# Patient Record
Sex: Female | Born: 1995
Health system: Southern US, Community
[De-identification: ages and names within clinical notes are randomized; demographics above are authoritative.]

## PROBLEM LIST (undated history)

## (undated) DIAGNOSIS — N200 Calculus of kidney: Secondary | ICD-10-CM

## (undated) HISTORY — PX: TONSILLECTOMY: SUR1361

---
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
## 2017-01-04 DIAGNOSIS — Z01419 Encounter for gynecological examination (general) (routine) without abnormal findings: Secondary | ICD-10-CM | POA: Diagnosis not present

## 2017-02-15 DIAGNOSIS — N200 Calculus of kidney: Secondary | ICD-10-CM | POA: Diagnosis not present

## 2017-04-26 ENCOUNTER — Emergency Department (HOSPITAL_COMMUNITY): Payer: Commercial Managed Care - PPO

## 2017-04-26 ENCOUNTER — Other Ambulatory Visit: Payer: Self-pay

## 2017-04-26 ENCOUNTER — Encounter (HOSPITAL_COMMUNITY): Payer: Self-pay | Admitting: *Deleted

## 2017-04-26 ENCOUNTER — Emergency Department (HOSPITAL_COMMUNITY)
Admission: EM | Admit: 2017-04-26 | Discharge: 2017-04-26 | Disposition: A | Discharge status: Home / Self Care | Payer: Commercial Managed Care - PPO | Attending: Emergency Medicine | Admitting: Emergency Medicine

## 2017-04-26 DIAGNOSIS — R1011 Right upper quadrant pain: Secondary | ICD-10-CM | POA: Diagnosis not present

## 2017-04-26 DIAGNOSIS — R109 Unspecified abdominal pain: Secondary | ICD-10-CM | POA: Diagnosis not present

## 2017-04-26 DIAGNOSIS — R1031 Right lower quadrant pain: Secondary | ICD-10-CM | POA: Insufficient documentation

## 2017-04-26 DIAGNOSIS — R112 Nausea with vomiting, unspecified: Secondary | ICD-10-CM | POA: Diagnosis not present

## 2017-04-26 DIAGNOSIS — R197 Diarrhea, unspecified: Secondary | ICD-10-CM | POA: Insufficient documentation

## 2017-04-26 DIAGNOSIS — Z79899 Other long term (current) drug therapy: Secondary | ICD-10-CM | POA: Diagnosis not present

## 2017-04-26 DIAGNOSIS — R1110 Vomiting, unspecified: Secondary | ICD-10-CM | POA: Diagnosis not present

## 2017-04-26 LAB — URINALYSIS, ROUTINE W REFLEX MICROSCOPIC
BILIRUBIN URINE: NEGATIVE
Bacteria, UA: NONE SEEN
Glucose, UA: NEGATIVE mg/dL
Ketones, ur: NEGATIVE mg/dL
LEUKOCYTES UA: NEGATIVE
NITRITE: NEGATIVE
PH: 5 (ref 5.0–8.0)
Protein, ur: 30 mg/dL — AB
SPECIFIC GRAVITY, URINE: 1.024 (ref 1.005–1.030)

## 2017-04-26 LAB — LIPASE, BLOOD: Lipase: 20 U/L (ref 11–51)

## 2017-04-26 LAB — CBC
HCT: 43.5 % (ref 36.0–46.0)
Hemoglobin: 14.6 g/dL (ref 12.0–15.0)
MCH: 30.5 pg (ref 26.0–34.0)
MCHC: 33.6 g/dL (ref 30.0–36.0)
MCV: 91 fL (ref 78.0–100.0)
PLATELETS: 265 10*3/uL (ref 150–400)
RBC: 4.78 MIL/uL (ref 3.87–5.11)
RDW: 13.2 % (ref 11.5–15.5)
WBC: 15.4 10*3/uL — AB (ref 4.0–10.5)

## 2017-04-26 LAB — COMPREHENSIVE METABOLIC PANEL
ALK PHOS: 75 U/L (ref 38–126)
ALT: 18 U/L (ref 14–54)
AST: 23 U/L (ref 15–41)
Albumin: 4.3 g/dL (ref 3.5–5.0)
Anion gap: 14 (ref 5–15)
BILIRUBIN TOTAL: 1 mg/dL (ref 0.3–1.2)
BUN: 13 mg/dL (ref 6–20)
CALCIUM: 9.2 mg/dL (ref 8.9–10.3)
CO2: 21 mmol/L — ABNORMAL LOW (ref 22–32)
CREATININE: 0.85 mg/dL (ref 0.44–1.00)
Chloride: 101 mmol/L (ref 101–111)
GFR calc Af Amer: >60 mL/min (ref >60)
GLUCOSE: 124 mg/dL — AB (ref 65–99)
Potassium: 4.3 mmol/L (ref 3.5–5.1)
Sodium: 136 mmol/L (ref 135–145)
TOTAL PROTEIN: 7.5 g/dL (ref 6.5–8.1)

## 2017-04-26 LAB — I-STAT BETA HCG BLOOD, ED (MC, WL, AP ONLY)

## 2017-04-26 MED ORDER — ONDANSETRON 8 MG PO TBDP: 8.0000 mg | ORAL_TABLET | Freq: Three times a day (TID) | ORAL | 0 refills | Status: AC | PRN

## 2017-04-26 MED ORDER — MORPHINE SULFATE (PF) 4 MG/ML IV SOLN
4.0000 mg | Freq: Once | INTRAVENOUS | Status: AC
  Administered 2017-04-26: 4 mg via INTRAVENOUS
  Filled 2017-04-26: qty 1

## 2017-04-26 MED ORDER — IOPAMIDOL (ISOVUE-300) INJECTION 61%
INTRAVENOUS | Status: AC
  Administered 2017-04-26: 100 mL
  Filled 2017-04-26: qty 100

## 2017-04-26 MED ORDER — ONDANSETRON HCL 4 MG/2ML IJ SOLN
4.0000 mg | Freq: Once | INTRAMUSCULAR | Status: AC
  Administered 2017-04-26: 4 mg via INTRAVENOUS
  Filled 2017-04-26: qty 2

## 2017-04-26 MED ORDER — ACETAMINOPHEN 325 MG PO TABS
650.0000 mg | ORAL_TABLET | Freq: Once | ORAL | Status: AC
Start: 2017-04-26 — End: 2017-04-26
  Administered 2017-04-26: 650 mg via ORAL
  Filled 2017-04-26: qty 2

## 2017-04-26 MED ORDER — ONDANSETRON 4 MG PO TBDP
4.0000 mg | ORAL_TABLET | Freq: Once | ORAL | Status: AC
  Administered 2017-04-26: 4 mg via ORAL
  Filled 2017-04-26: qty 1

## 2017-04-26 MED ORDER — SODIUM CHLORIDE 0.9 % IV BOLUS (SEPSIS)
1000.0000 mL | Freq: Once | INTRAVENOUS | Status: AC
  Administered 2017-04-26: 1000 mL via INTRAVENOUS

## 2017-04-26 MED ORDER — SODIUM CHLORIDE 0.9 % IV SOLN
Freq: Once | INTRAVENOUS | Status: AC
  Administered 2017-04-26: 15:00:00 via INTRAVENOUS

## 2017-04-26 NOTE — ED Triage Notes (Signed)
To ED for eval of RLQ pain, n/v/d that started this am. [facility] Labs drawn with an elevated wbc. States she felt fine yesterday. Actively vomiting in triage

## 2017-04-26 NOTE — Discharge Instructions (Signed)
Take tylenol or motrin for pain and fever. Take zofran for nausea and vomiting as prescribed as needed. Drink plenty of fluids. Rest. Follow up with family doctor or school doctor if not improving in 2-3 days. Return if worsening.

## 2017-04-26 NOTE — ED Provider Notes (Signed)
[facility] EM[facility]ENC[facility] Provider Note   CSN: [id number] Arrival date & time: 04/26/17  1200     History   Chief Complaint Chief Complaint  Patient presents with  . Abdominal Pain  . Emesis  . Nausea    HPI Noralee CharsSarah E Peeks is a 22 y.o. female.  HPI Noralee CharsSarah E Kearl is a 22 y.o. female with hx of kidney stones and traumatic kidney lac, presents to ED with complaint of nausea, vomiting, abdominal pain. Pt states symptoms started this morning. Reports associated diarrhea. [facility], had negative influenza panel, had CBC done which showed elevated white blood cell count, patient was sent here for further evaluation.  Patient states that she is having some abdominal pain that is worse on the right side.  She was given Zofran at triage and feels slightly better.  She reports associated dizziness and weakness, states that "feel dehydrated."  Patient does have polycystic ovarian syndrome, and has irregular menstrual cycles, last menstrual cycle was in November.  Denies being pregnant.  No other treatment prior to coming in.  Nothing making her symptoms better or worse.  Past Medical History:  Diagnosis Date  . Kidney stone     Patient Active Problem List   Diagnosis Date Noted  . Renal vein laceration, right, initial encounter 07/28/2016  . Abnormality of gait 11/09/2013    Past Surgical History:  Procedure Laterality Date  . TONSILLECTOMY      OB History    Gravida Para Term Preterm AB Living   1             SAB TAB Ectopic Multiple Live Births                   Home Medications    Prior to Admission medications   Medication Sig Start Date End Date Taking? Authorizing Provider  oxyCODONE (OXY IR/ROXICODONE) 5 MG immediate release tablet Take 1 tablet (5 mg total) by mouth every 4 (four) hours as needed for moderate pain. 07/30/16   Focht, Joyce CopaJessica L, PA  progesterone (PROMETRIUM) 200 MG capsule Take 200 mg by mouth every 3  (three) months. Every 3 months for 10 days or until periods starts.    [provider]    Family History No family history on file.  Social History Social History   Tobacco Use  . Smoking status: Never Smoker  . Smokeless tobacco: Never Used  Substance Use Topics  . Alcohol use: No  . Drug use: No     Allergies   Patient has no known allergies.   Review of Systems Review of Systems  Constitutional: Positive for chills and fever.  Respiratory: Negative for cough, chest tightness and shortness of breath.   Cardiovascular: Negative for chest pain, palpitations and leg swelling.  Gastrointestinal: Positive for abdominal pain, diarrhea, nausea and vomiting. Negative for blood in stool.  Genitourinary: Negative for difficulty urinating, dysuria, flank pain, frequency, hematuria, pelvic pain, vaginal bleeding, vaginal discharge and vaginal pain.  Musculoskeletal: Negative for arthralgias, myalgias, neck pain and neck stiffness.  Skin: Negative for rash.  Neurological: Negative for dizziness, weakness and headaches.  All other systems reviewed and are negative.    Physical Exam Updated Vital Signs BP 115/78 (BP Location: Left Arm)   Pulse 99   Temp 99.5 F (37.5 C) (Oral)   Resp 16   LMP 02/13/2017 (Approximate)   SpO2 98%   Physical Exam  Constitutional: She appears  well-developed and well-nourished. No distress.  HENT:  Head: Normocephalic.  Eyes: Conjunctivae are normal.  Neck: Neck supple.  Cardiovascular: Normal rate, regular rhythm and normal heart sounds.  Pulmonary/Chest: Effort normal and breath sounds normal. No respiratory distress. She has no wheezes. She has no rales.  Abdominal: Soft. Bowel sounds are normal. She exhibits no distension. There is tenderness in the right upper quadrant and right lower quadrant. There is no rebound.  Musculoskeletal: She exhibits no edema.  Neurological: She is alert.  Skin: Skin is warm and dry.  Psychiatric: She  has a normal mood and affect. Her behavior is normal.  Nursing note and vitals reviewed.    ED Treatments / Results  Labs (all labs ordered are listed, but only abnormal results are displayed) Labs Reviewed  CBC - Abnormal; Notable for the following components:      Result Value   WBC 15.4 (*)    All other components within normal limits  URINALYSIS, ROUTINE W REFLEX MICROSCOPIC - Abnormal; Notable for the following components:   Hgb urine dipstick MODERATE (*)    Protein, ur 30 (*)    Squamous Epithelial / LPF 0-5 (*)    All other components within normal limits  LIPASE, BLOOD  COMPREHENSIVE METABOLIC PANEL  I-STAT BETA HCG BLOOD, ED ([facility], [facility], [facility] ONLY)    EKG  EKG Interpretation None       Radiology No results found.  Procedures Procedures (including critical care time)  Medications Ordered in ED Medications  sodium chloride 0.9 % bolus 1,000 mL (not administered)  ondansetron (ZOFRAN) injection 4 mg (not administered)  0.9 %  sodium chloride infusion (not administered)  ondansetron (ZOFRAN-ODT) disintegrating tablet 4 mg (4 mg Oral Given 04/26/17 1302)     Initial Impression / Assessment and Plan / ED Course  I have reviewed the triage vital signs and the nursing notes.  Pertinent labs & imaging results that were available during my care of the patient were reviewed by me and considered in my medical decision making (see chart for details).     Patient in the emergency department with nausea, vomiting, diarrhea, right-sided abdominal pain.  Fever of 100.5. Differential includes gastroenteritis versus cholecystitis, versus appendicitis.  White blood cell count is 15.4.  She is not pregnant.  CMP and lipase pending.  Will get CT abdomen pelvis for further evaluation.   5:24 PM CT scan negative for appendicitis.  No other acute findings. Pt feels much better. She received IV fluids, zofran, morphine. She has drank two cups of sprite, holding it down, no reccurent  nausea. Most likely viral gastroenteritis. Plan to dc home with anti emetics and otherwise symptomatic treatment.  Return precautions discussed.   Vitals:   04/26/17 1500 04/26/17 1515 04/26/17 1545 04/26/17 1630  BP: 107/66 110/72 103/69   Pulse: 97 98 99 (!) 104  Resp:      Temp:      TempSrc:      SpO2: 99% 100% 100% 99%    Final Clinical Impressions(s) / ED Diagnoses   Final diagnoses:  Nausea vomiting and diarrhea    ED Discharge Orders    None       Iona Coach 04/26/17 1729    Tilden Fossa, MD 04/26/17 [phone]

## 2017-04-26 NOTE — ED Notes (Signed)
Patient got a drink per PA request

## 2018-02-12 IMAGING — CT CT ABD-PELV W/ CM
2 of 5 series · 14 of 47 positions shown, 17 images · IV contrast (APPLIED)
Comparison: None.

CLINICAL DATA: Right-sided abdominal pain after being kicked while
playing soccer

EXAM:
CT ABDOMEN AND PELVIS WITH CONTRAST
TECHNIQUE: Multidetector CT imaging of the abdomen and pelvis was performed
using the standard protocol following bolus administration of
intravenous contrast.
CONTRAST:  100mL [id number] IOPAMIDOL ([id number]) INJECTION 61%

[Series 3: abd/ pelvis 5.0 i30f 2 · axial · 0.72mm/px · z∈[-639,-164]mm · 11 of 107 slices shown, 14 images]
[im 6/107  brain]
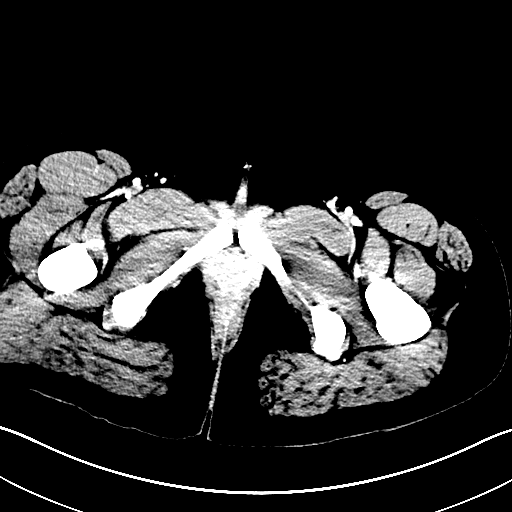
[im 6/107  bone]
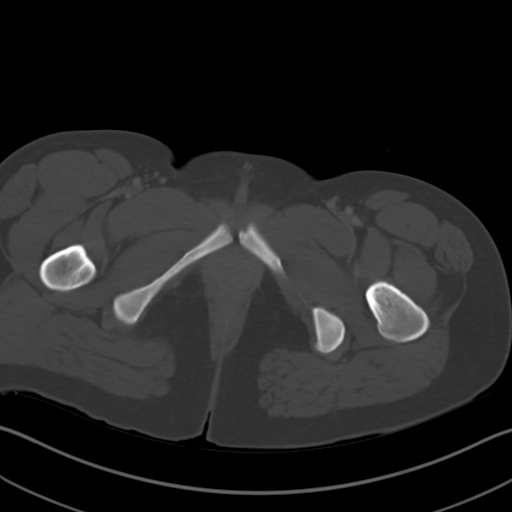
[im 18/107  brain]
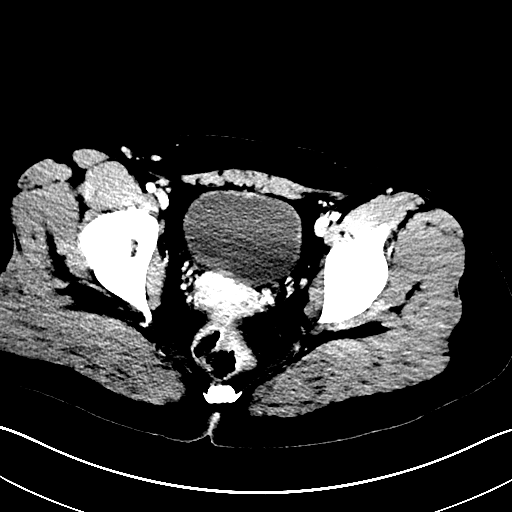
[im 24/107  brain]
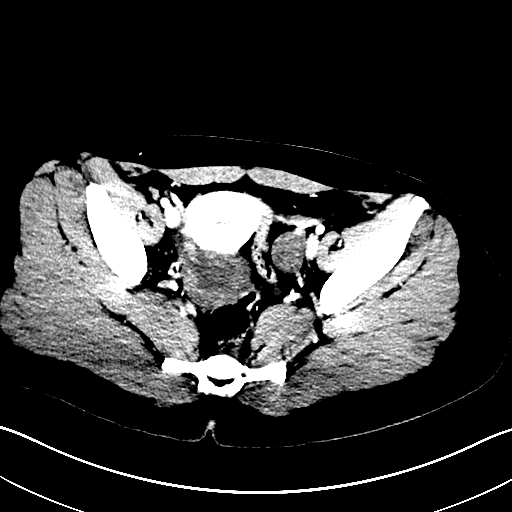
[im 36/107  brain]
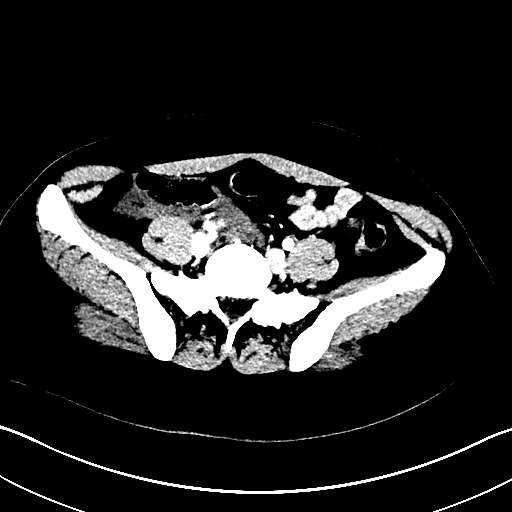
[im 42/107  brain]
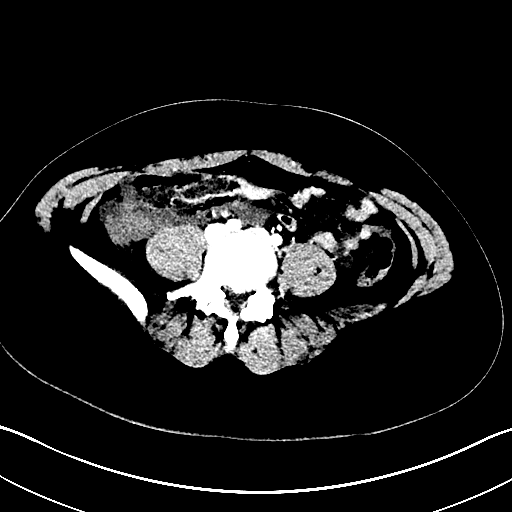
[im 42/107  bone]
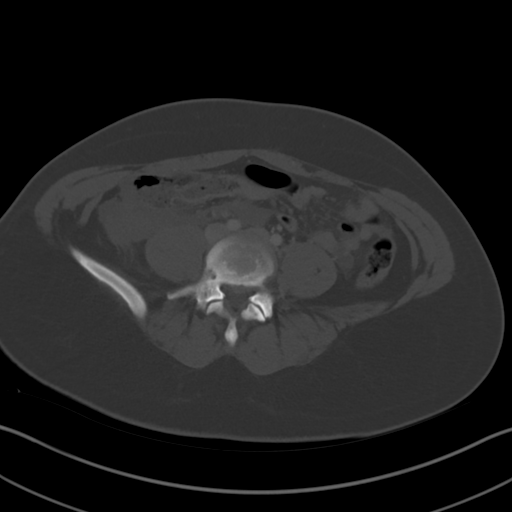
[im 54/107  brain]
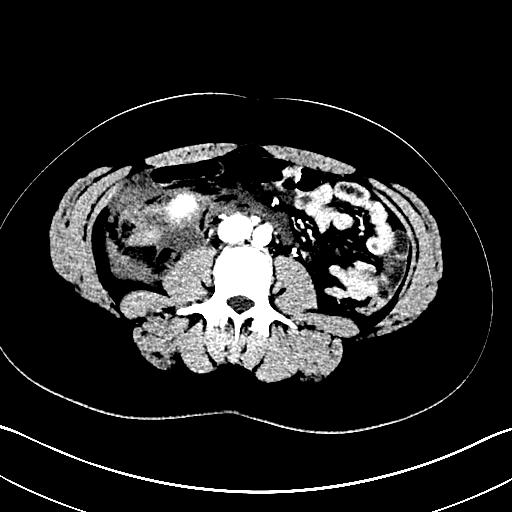
[im 65/107  brain]
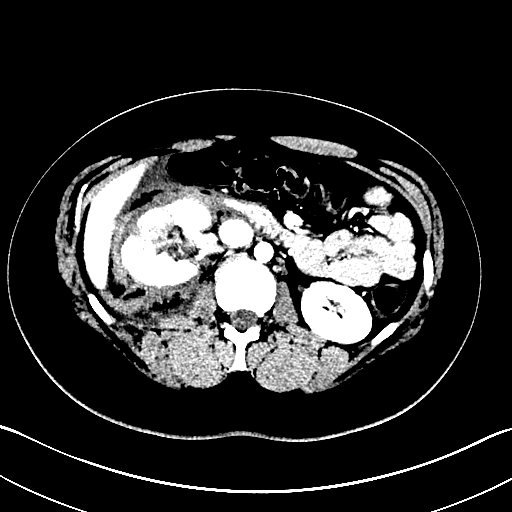
[im 71/107  brain]
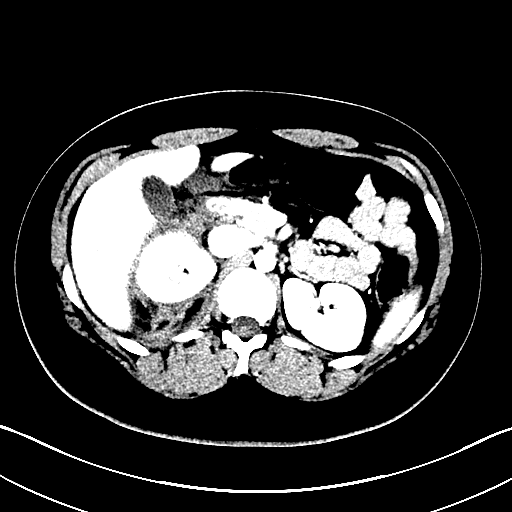
[im 83/107  brain]
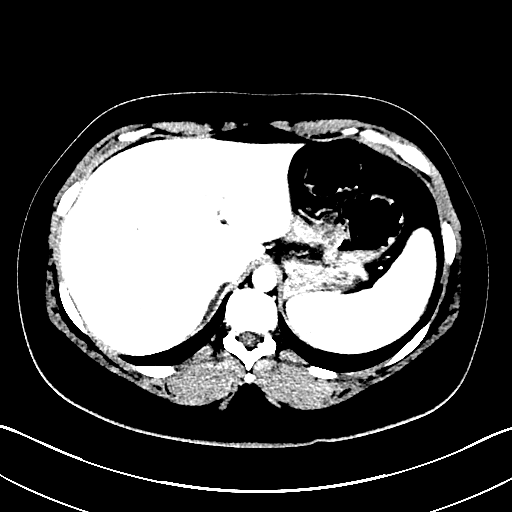
[im 83/107  bone]
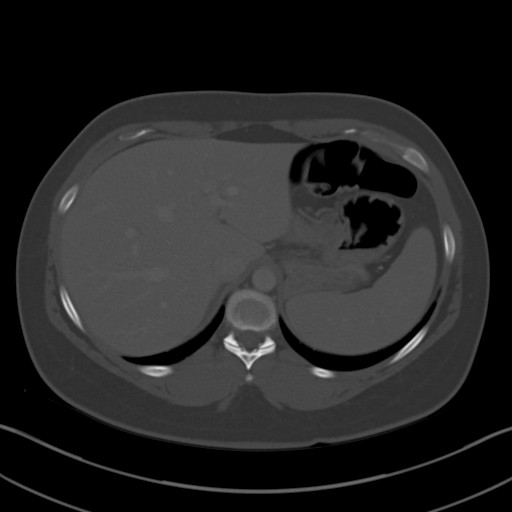
[im 89/107  brain]
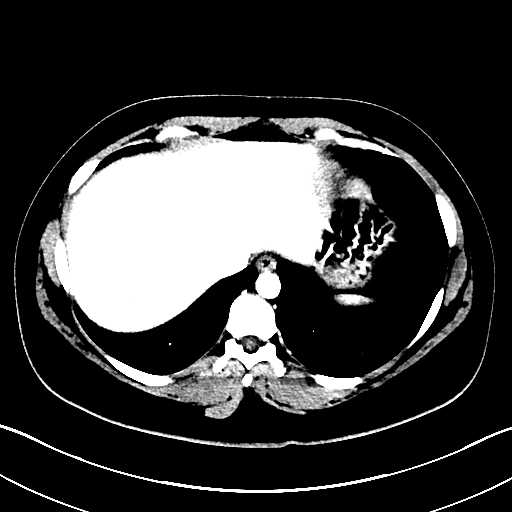
[im 101/107  brain]
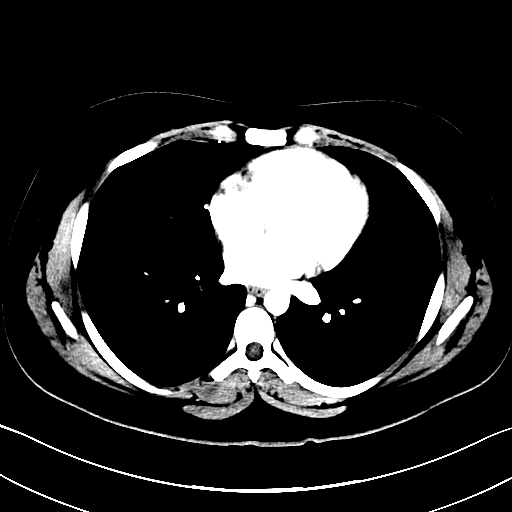

[Series 6: coronal soft tissue · coronal · 0.71mm/px · 3 of 101 slices shown]
[im 34/101  brain]
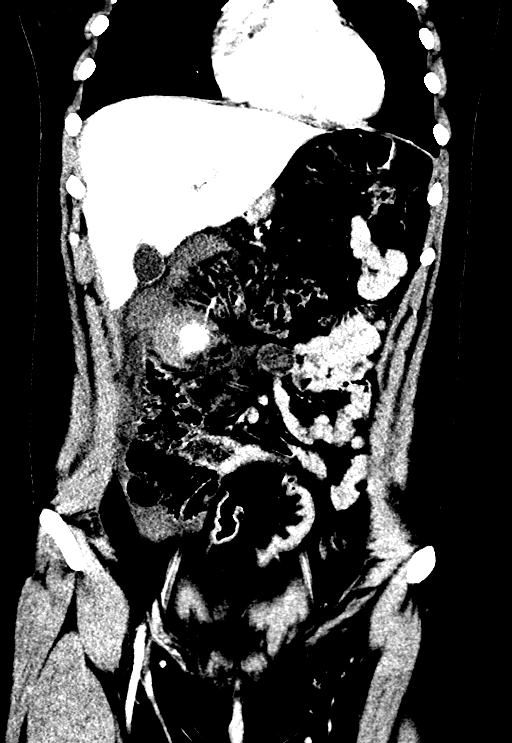
[im 45/101  brain]
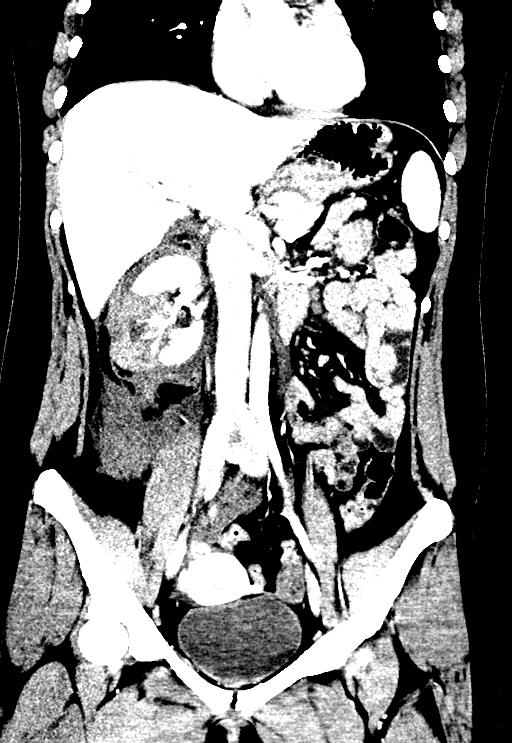
[im 56/101  brain]
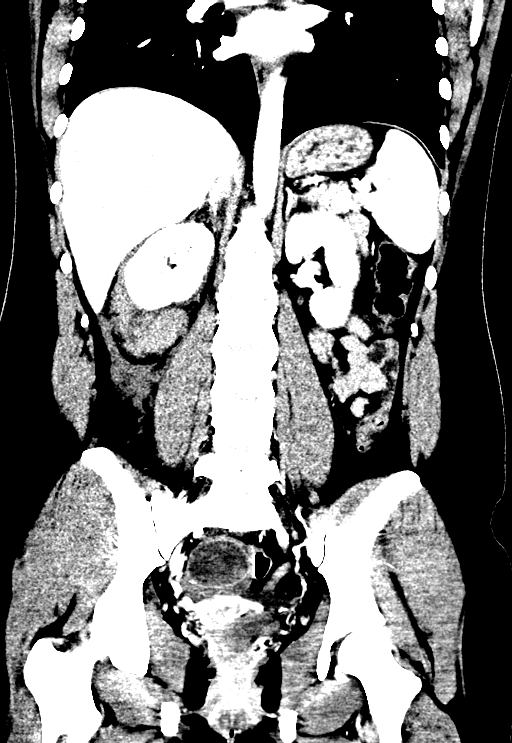

[14 of 47 positions shown; findings below may reference images not displayed]

FINDINGS: Lower chest: No acute abnormality.

Hepatobiliary: No focal liver abnormality is seen. No gallstones,
gallbladder wall thickening, or biliary dilatation.

Pancreas: Unremarkable. No pancreatic ductal dilatation or
surrounding inflammatory changes.

Spleen: Normal in size without focal abnormality.

Adrenals/Urinary Tract: Both adrenals are normal. There is a
laceration of the right kidney, involving the hilum. Moderately
large right retroperitoneal hemorrhage. No evidence of active
extravasation at the time of this scan. No evidence of urine leak on
delayed imaging. Renal artery and vein appear intact. Left kidney is
normal. Urinary bladder is unremarkable.

Stomach/Bowel: Stomach is within normal limits. Colon appears
normal. No evidence of bowel wall thickening, distention, or
inflammatory changes.

Vascular/Lymphatic: No significant vascular findings are present. No
enlarged abdominal or pelvic lymph nodes.

Reproductive: Uterus and left ovary unremarkable. Right ovarian cyst
measuring a little over 4 cm.

Other: No extraluminal gas.

Musculoskeletal: No evidence of fracture.
IMPRESSION: Right renal laceration with moderately large right retroperitoneal
hemorrhage. No evidence of active extravasation of the time of this
scan. No evidence of urine leak on delayed imaging. These results
were called by telephone at the time of interpretation on 07/28/2016
at [DATE] to Dr. RANTONA BHEBHE , who verbally acknowledged these
results.

## 2018-11-11 IMAGING — CT CT ABD-PELV W/ CM
2 of 4 series · 16 of 46 positions shown, 18 images · IV contrast (APPLIED)
Comparison: None.

CLINICAL DATA: Right lower quadrant pain, nausea/vomiting

EXAM:
CT ABDOMEN AND PELVIS WITH CONTRAST
TECHNIQUE: Multidetector CT imaging of the abdomen and pelvis was performed
using the standard protocol following bolus administration of
intravenous contrast.
CONTRAST:  100mL [id number] IOPAMIDOL ([id number]) INJECTION 61%

[Series 3: abd/ pelvis 5.0 i30f 2 · axial · 0.90mm/px · z∈[+732,+1228]mm · 13 of 109 slices shown, 15 images]
[im 5/109  soft-tissue]
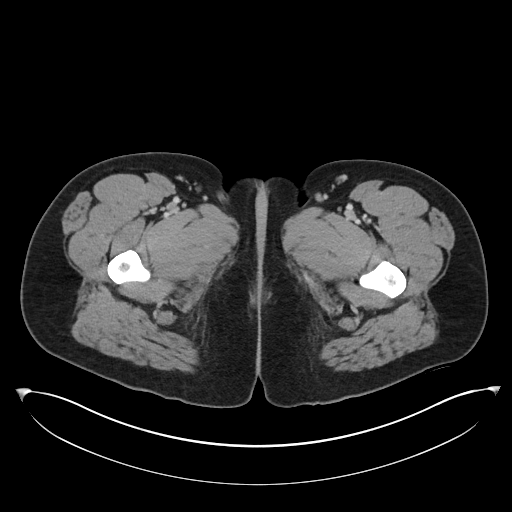
[im 5/109  bone]
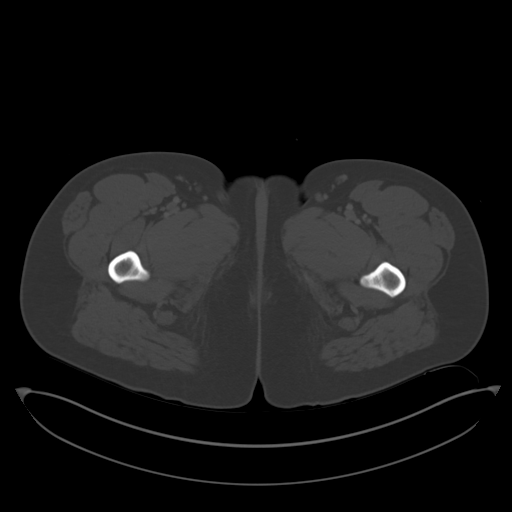
[im 13/109  soft-tissue]
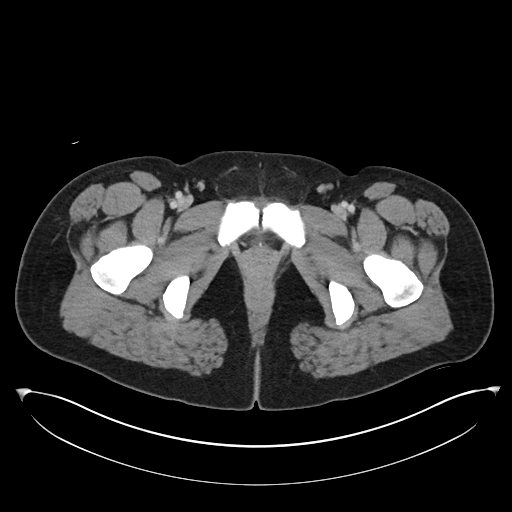
[im 22/109  soft-tissue]
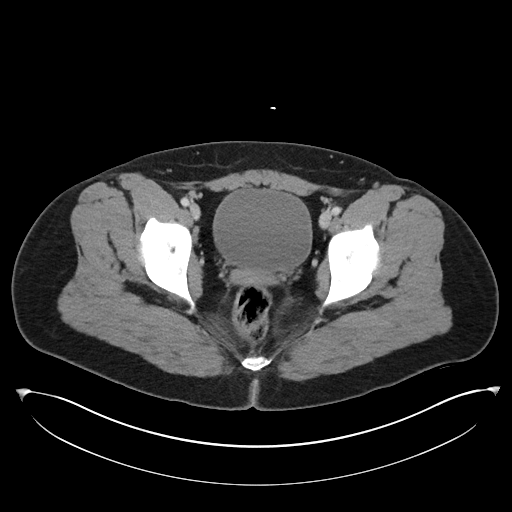
[im 31/109  soft-tissue]
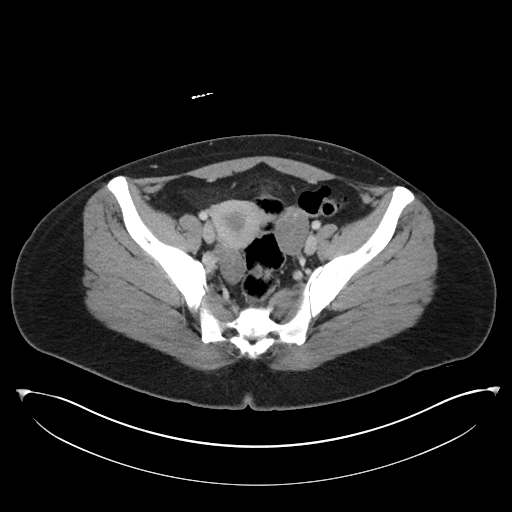
[im 39/109  soft-tissue]
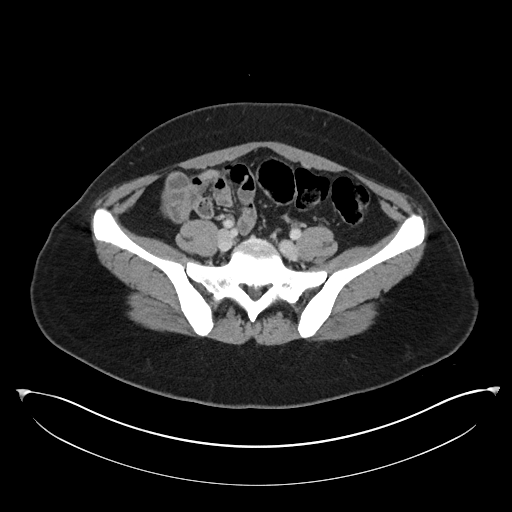
[im 48/109  soft-tissue]
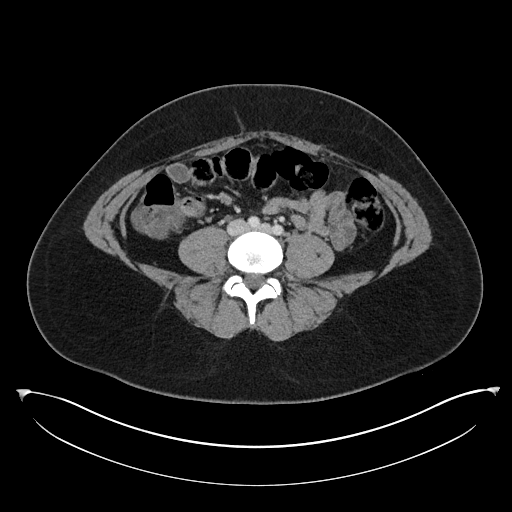
[im 57/109  soft-tissue]
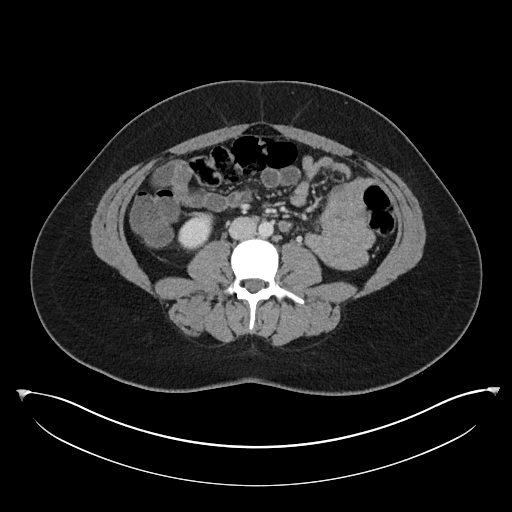
[im 61/109  soft-tissue]
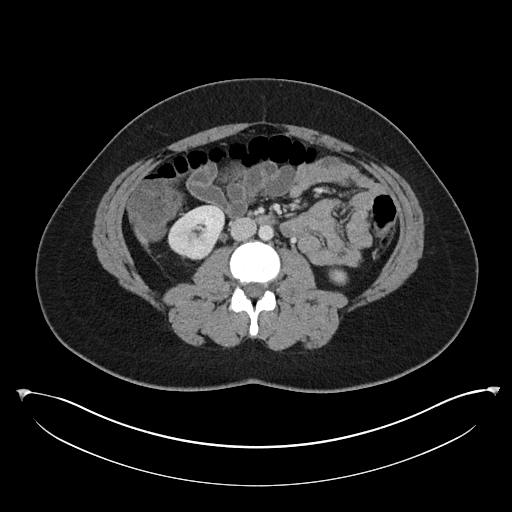
[im 70/109  soft-tissue]
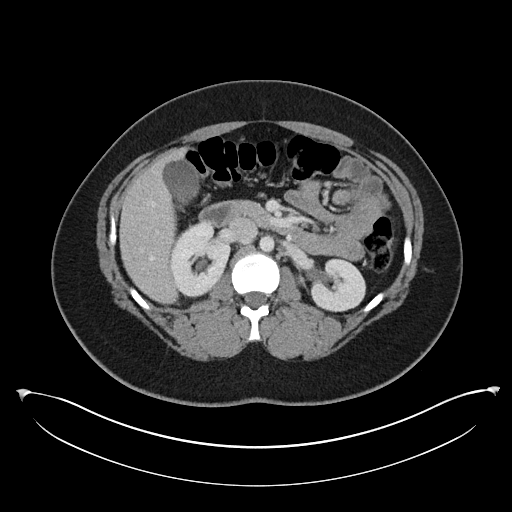
[im 70/109  bone]
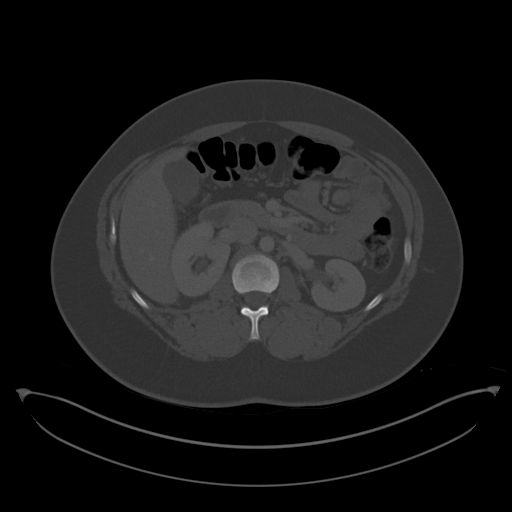
[im 78/109  soft-tissue]
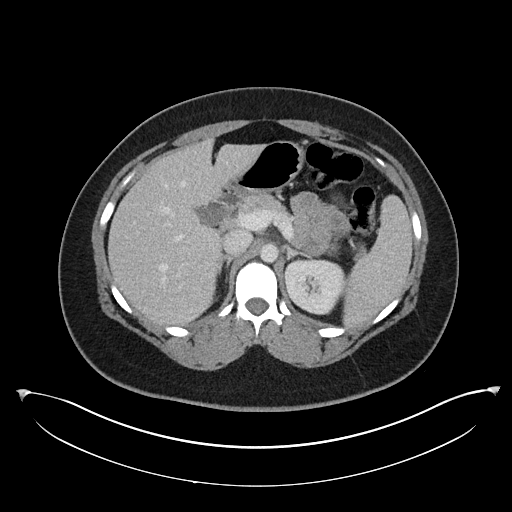
[im 87/109  soft-tissue]
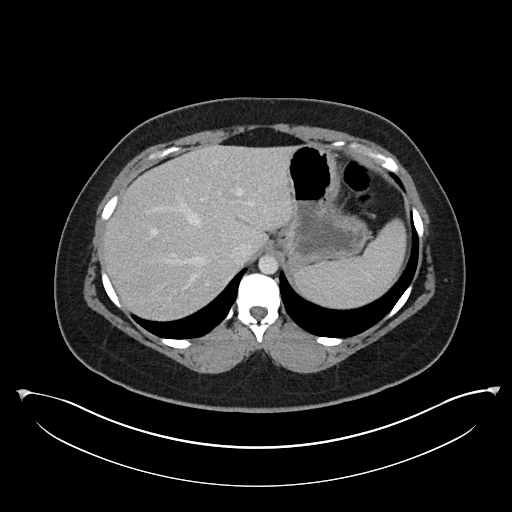
[im 96/109  soft-tissue]
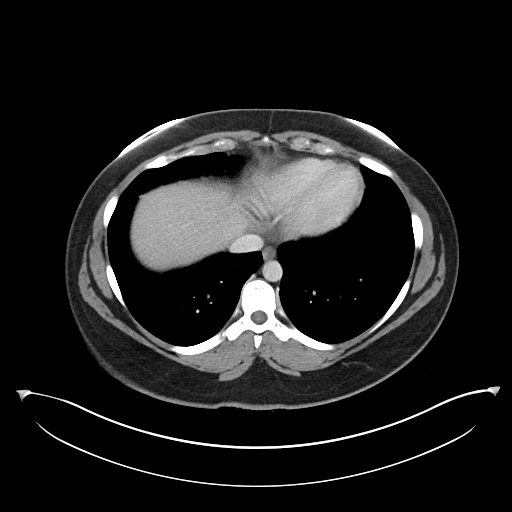
[im 104/109  soft-tissue]
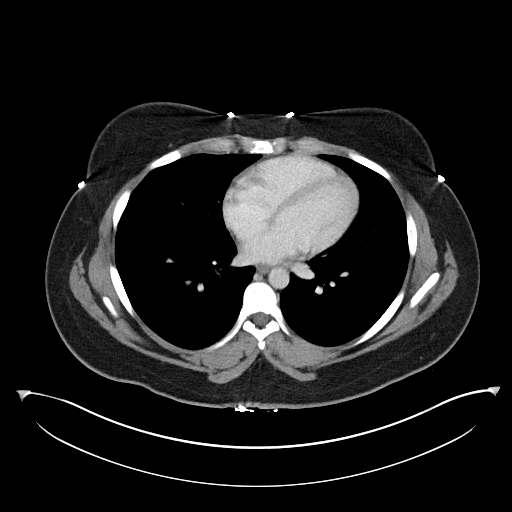

[Series 6: coronal soft tissue · coronal · 0.91mm/px · 3 of 111 slices shown]
[im 37/111  soft-tissue]
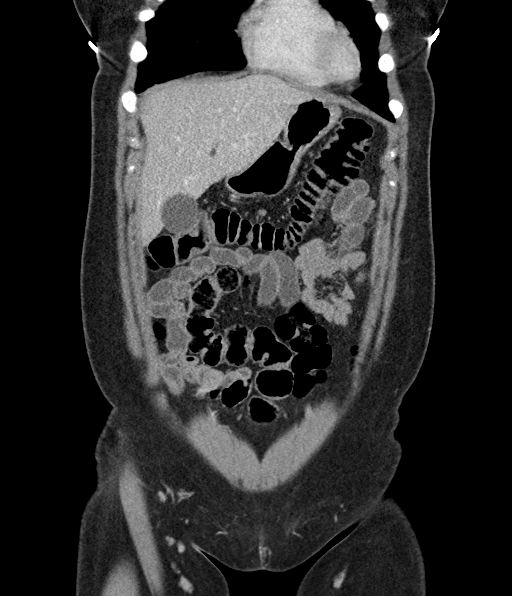
[im 49/111  soft-tissue]
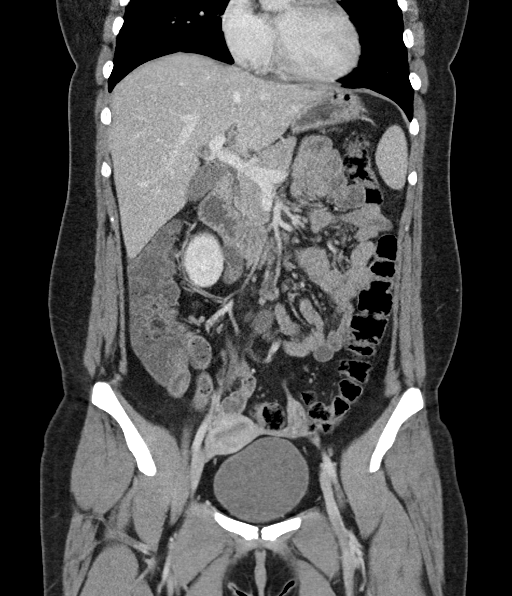
[im 62/111  soft-tissue]
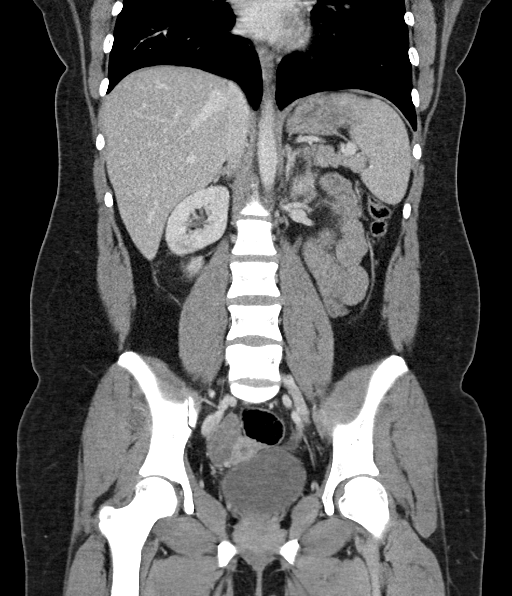

[16 of 46 positions shown; findings below may reference images not displayed]

FINDINGS: Lower chest: Lung bases are clear.

Hepatobiliary: Liver is within normal limits.

Gallbladder is unremarkable. No intrahepatic or extrahepatic ductal
dilatation.

Pancreas: Within normal limits.

Spleen: Within normal limits.

Adrenals/Urinary Tract: Adrenal glands are within normal limits.

Interpolar scarring in the right kidney with associated 2 mm
parenchymal calcification (coronal image 61). This may be related to
the patient's reported history of right renal vein laceration.

Mildly heterogeneous perfusion in the lateral left upper kidney
(series 3/image 35).

No renal calculi or hydronephrosis.

Bladder is within normal limits.

Stomach/Bowel: Stomach is within normal limits.

No evidence of bowel obstruction.

Normal appendix (sagittal image 45).

Vascular/Lymphatic: No evidence of abdominal aortic aneurysm.

No suspicious abdominopelvic lymphadenopathy.

Reproductive: Arcuate uterus configuration.

Bilateral ovaries are within normal limits.

Other: No abdominopelvic ascites.

Musculoskeletal: Visualized osseous structures are within normal
limits.
IMPRESSION: No evidence of bowel obstruction.  Normal appendix.

Scarring involving the interpolar right kidney, likely related to
the patient's history of right renal vein laceration.

No CT findings to account for the patient's right lower quadrant
abdominal pain.
